# Patient Record
Sex: Female | Born: 1965 | Race: White | Hispanic: No | Marital: Married | Smoking: Former smoker
Health system: Southern US, Community
[De-identification: ages and names within clinical notes are randomized; demographics above are authoritative.]

## PROBLEM LIST (undated history)

## (undated) DIAGNOSIS — IMO0001 Reserved for inherently not codable concepts without codable children: Secondary | ICD-10-CM

## (undated) DIAGNOSIS — K219 Gastro-esophageal reflux disease without esophagitis: Secondary | ICD-10-CM

## (undated) DIAGNOSIS — N750 Cyst of Bartholin's gland: Secondary | ICD-10-CM

## (undated) HISTORY — DX: Reserved for inherently not codable concepts without codable children: IMO0001

## (undated) HISTORY — PX: FINGER SURGERY: SHX640

## (undated) HISTORY — DX: Gastro-esophageal reflux disease without esophagitis: K21.9

## (undated) HISTORY — PX: ABDOMINAL HYSTERECTOMY: SHX81

## (undated) HISTORY — DX: Cyst of Bartholin's gland: N75.0

---

## 2000-08-06 ENCOUNTER — Other Ambulatory Visit: Admission: RE | Admit: 2000-08-06 | Discharge: 2000-08-06 | Payer: Self-pay | Admitting: Obstetrics and Gynecology

## 2001-07-16 ENCOUNTER — Encounter: Payer: Self-pay | Admitting: Obstetrics and Gynecology

## 2001-07-16 ENCOUNTER — Ambulatory Visit (HOSPITAL_COMMUNITY): Admission: RE | Admit: 2001-07-16 | Discharge: 2001-07-16 | Payer: Self-pay | Admitting: Obstetrics and Gynecology

## 2002-01-23 ENCOUNTER — Inpatient Hospital Stay (HOSPITAL_COMMUNITY): Admission: RE | Admit: 2002-01-23 | Discharge: 2002-01-24 | Payer: Self-pay | Admitting: Obstetrics and Gynecology

## 2003-02-18 ENCOUNTER — Ambulatory Visit (HOSPITAL_COMMUNITY): Admission: RE | Admit: 2003-02-18 | Discharge: 2003-02-18 | Payer: Self-pay | Admitting: Obstetrics and Gynecology

## 2004-01-26 ENCOUNTER — Ambulatory Visit (HOSPITAL_COMMUNITY): Admission: RE | Admit: 2004-01-26 | Discharge: 2004-01-26 | Payer: Self-pay | Admitting: Obstetrics and Gynecology

## 2004-06-15 ENCOUNTER — Ambulatory Visit (HOSPITAL_COMMUNITY): Admission: RE | Admit: 2004-06-15 | Discharge: 2004-06-15 | Payer: Self-pay | Admitting: Obstetrics and Gynecology

## 2005-02-06 ENCOUNTER — Ambulatory Visit: Payer: Self-pay | Admitting: Internal Medicine

## 2005-02-14 ENCOUNTER — Ambulatory Visit (HOSPITAL_COMMUNITY): Admission: RE | Admit: 2005-02-14 | Discharge: 2005-02-14 | Payer: Self-pay | Admitting: Obstetrics and Gynecology

## 2005-12-05 ENCOUNTER — Ambulatory Visit (HOSPITAL_COMMUNITY): Admission: RE | Admit: 2005-12-05 | Discharge: 2005-12-05 | Payer: Self-pay | Admitting: Obstetrics and Gynecology

## 2006-12-07 ENCOUNTER — Ambulatory Visit (HOSPITAL_COMMUNITY): Admission: RE | Admit: 2006-12-07 | Discharge: 2006-12-07 | Payer: Self-pay | Admitting: Obstetrics and Gynecology

## 2007-08-25 IMAGING — CT CT ABDOMEN W/ CM
2 of 3 series · 14 of 32 positions shown, 20 images · IV contrast (CONTRAST)
Comparison: 01/26/04.

CLINICAL DATA: Chronic left abdominal pain.
ABDOMEN CT WITH CONTRAST:
TECHNIQUE: Multidetector CT imaging of the abdomen was performed following the standard protocol during bolus administration of intravenous contrast.
Contrast:  100 cc Omnipaque 300.

[Series 3258: — · axial · 0.63mm/px · z∈[+1580,+1814]mm · 12 of 57 slices shown, 18 images (1 of 2)]
[im 5/57  soft-tissue]
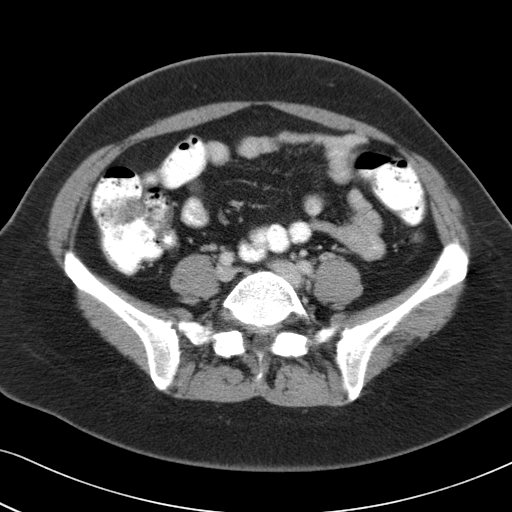
[im 5/57  bone]
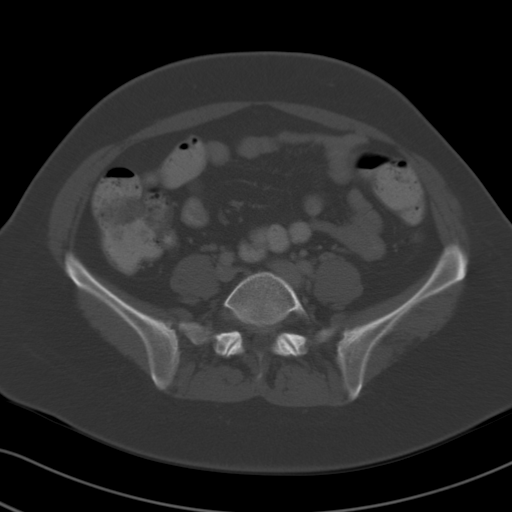
[im 9/57  soft-tissue]
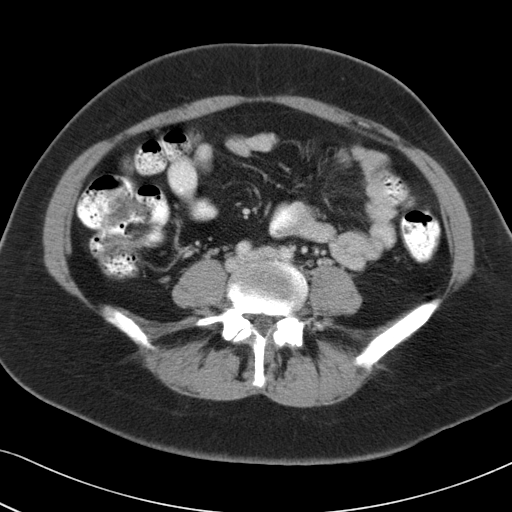
[im 13/57  soft-tissue]
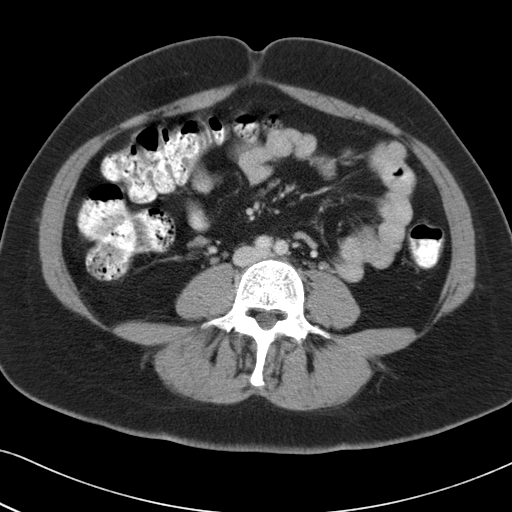
[im 18/57  soft-tissue]
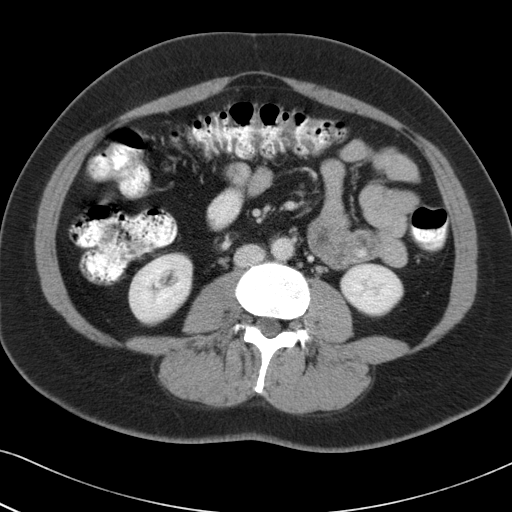
[im 22/57  soft-tissue]
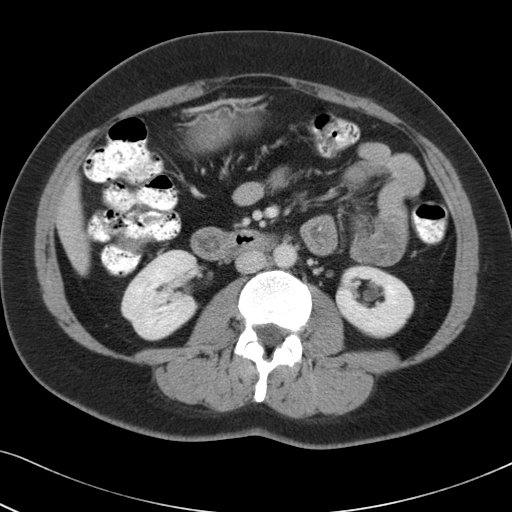
[im 26/57  soft-tissue]
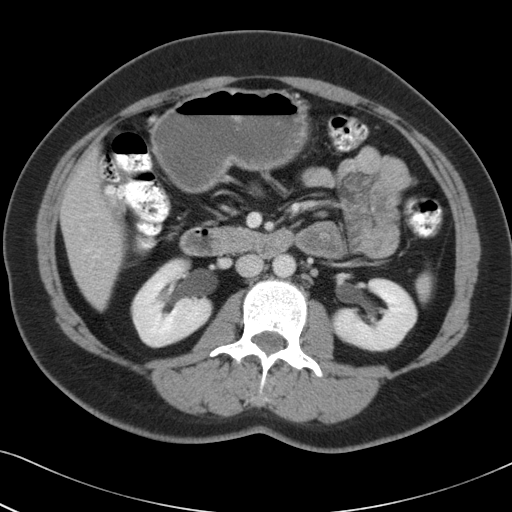
[im 31/57  soft-tissue]
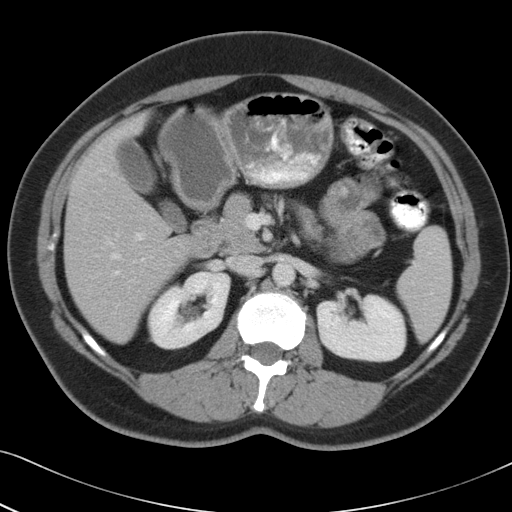
[im 35/57  soft-tissue]
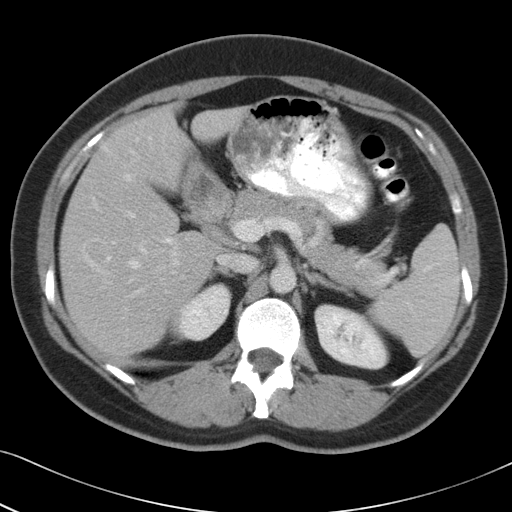
[im 39/57  soft-tissue]
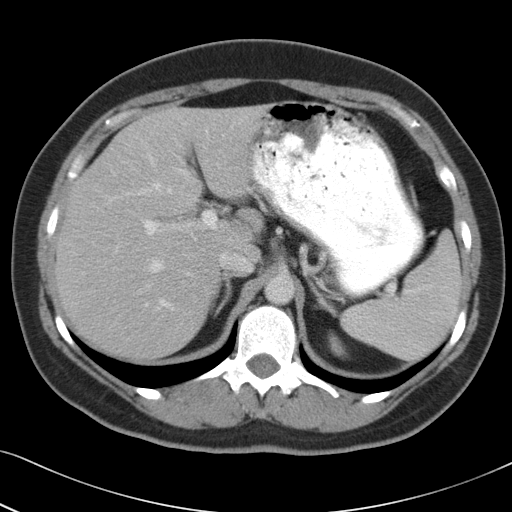
[im 39/57  lung]
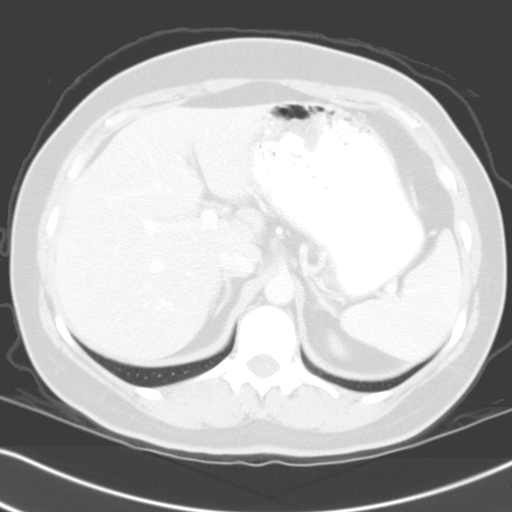
[im 39/57  bone]
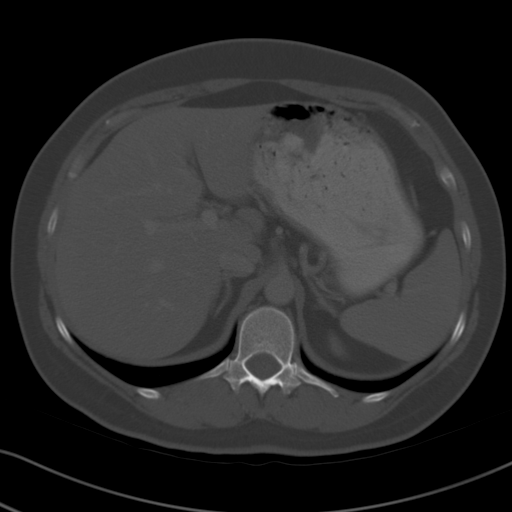
[im 44/57  soft-tissue]
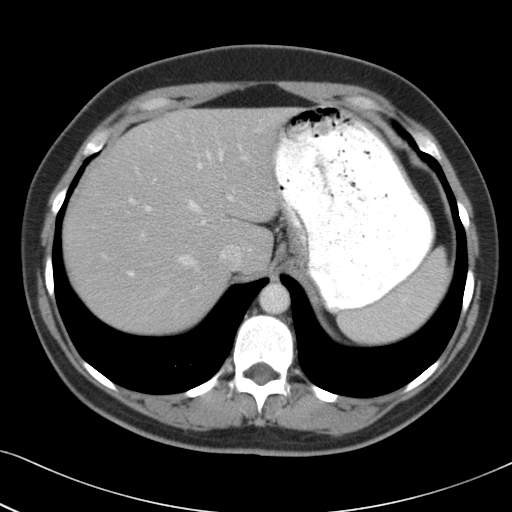
[im 44/57  lung]
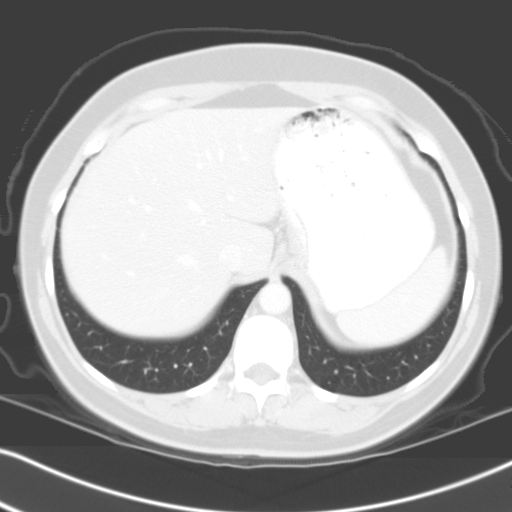
[im 48/57  soft-tissue]
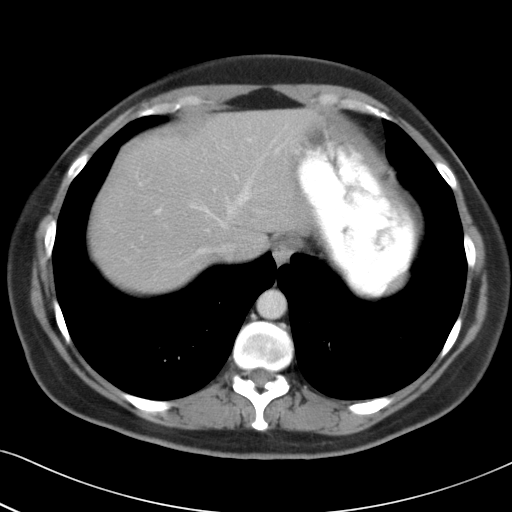
[im 48/57  lung]
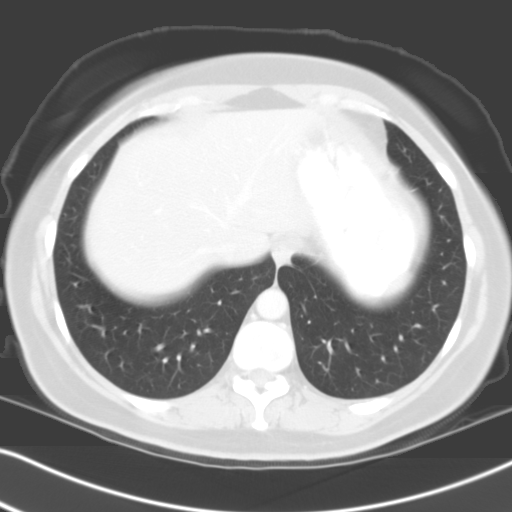
[im 52/57  soft-tissue]
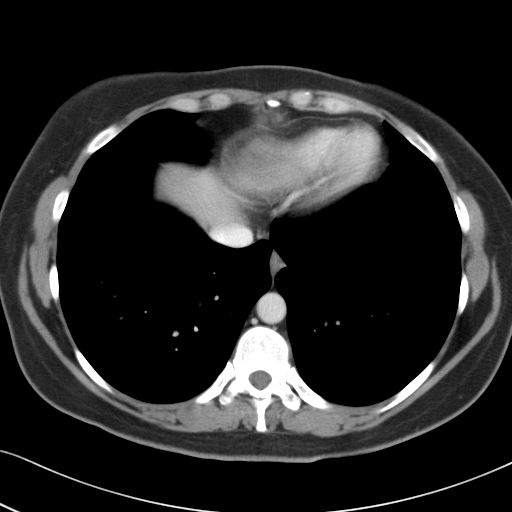
[im 52/57  lung]
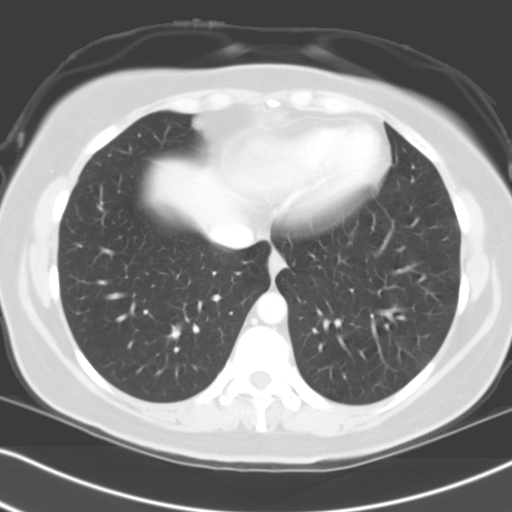

[Series 3260: — · axial · 0.63mm/px · z∈[+1740,+1764]mm · 2 of 25 slices shown (2 of 2)]
[im 5/25  soft-tissue]
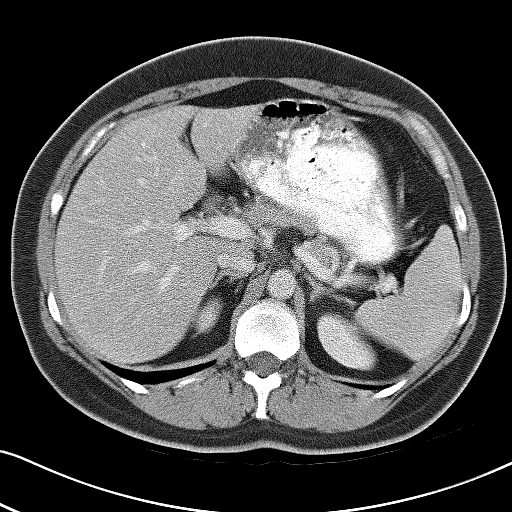
[im 10/25  soft-tissue]
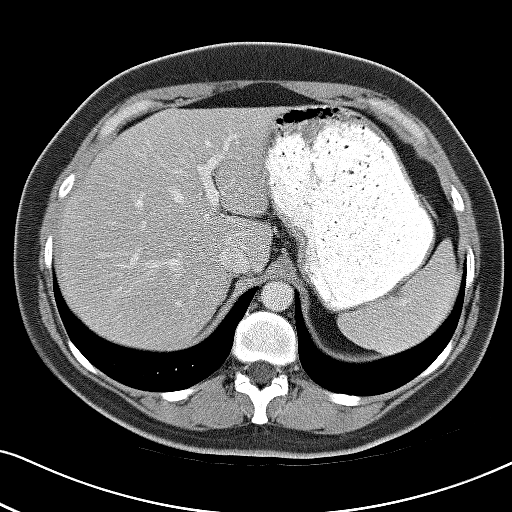

[14 of 32 positions shown; findings below may reference images not displayed]

FINDINGS: The liver, gallbladder, spleen, adrenal glands, kidneys, and pancreas are unremarkable.  No enlarged lymph nodes, free fluid, abdominal aortic aneurysm, or biliary dilatation identified.  A few scattered descending colonic diverticula are identified without evidence of diverticulitis.  The remainder of the visualized bowel is unremarkable.  Visualized portions of the SMA, IMA, celiac artery and SMV are patent.
IMPRESSION: Unremarkable CT scan of the abdomen with contrast.

## 2007-12-10 ENCOUNTER — Ambulatory Visit (HOSPITAL_COMMUNITY): Admission: RE | Admit: 2007-12-10 | Discharge: 2007-12-10 | Payer: Self-pay | Admitting: Obstetrics and Gynecology

## 2009-06-24 ENCOUNTER — Ambulatory Visit (HOSPITAL_COMMUNITY): Admission: RE | Admit: 2009-06-24 | Discharge: 2009-06-24 | Payer: Self-pay | Admitting: Obstetrics and Gynecology

## 2010-05-20 NOTE — Op Note (Signed)
NAME:  Julie Le, Julie Le                         ACCOUNT NO.:  [id number]   MEDICAL RECORD NO.:  [id number]                   PATIENT TYPE:  INP   LOCATION:  A302                                 FACILITY:  [facility]   PHYSICIAN:  Tilda Burrow, M.D.              DATE OF BIRTH:  1965/05/08   DATE OF PROCEDURE:  01/23/2002  DATE OF DISCHARGE:                                 OPERATIVE REPORT   PREOPERATIVE DIAGNOSIS:  Menometrorrhagia.   POSTOPERATIVE DIAGNOSIS:  Menometrorrhagia.   PROCEDURE:  Vaginal hysterectomy.   SURGEON:  Tilda Burrow, M.D.   ASSISTANTCurley Spice, CST and [place], [place]   ANESTHESIA:  General by Dennison Mascot, CRNA   COMPLICATIONS:  None.   FINDINGS:  Uterine descensus to just inside introitus with minimal traction,  easily mobile normal adnexal structures.   DETAILS OF PROCEDURE:  The patient was taken to the operating room and  prepped and draped int he usual fashion for vaginal procedure with the legs  supported in high lithotomy candy cane stirrups.  After prepping and  draping, the weighted speculum, 3-inch length, was placed in the posterior  vagina.  The cervix grasped with a Lahey thyroid tenaculum and pulled to the  introitus.  The cervicovaginal fornix was opened anteriorly, lifting the  bladder up and identifying the vesicouterine reflection of peritoneum and  entering with out difficulty.  The uterus could be flipped anteriorly, as in  doderlein technique.  The Lahey thyroid tenaculum was placed in the uterine  fundus and procedure proceeded from the cornual region towards the cervix.  We first used the curved Zeppelin clamp to cross-clamped the round ligament,  fallopian tube and utero-ovarian ligament, which were then transected,  ligated doubly with #0 chromic and tagged.   The broad ligament was then inferiorly taken down in small bites using  Zeppelin clamp, mayo scissor transection and #0 chromic suture ligature.  We  marched down on  each side in symmetric fashion until reaching the level of  the insertion of the uterosacral ligaments.  At this time, it was quite easy  to identify the uterosacral ligaments and a #0 chromic silk suture was  placed approximately 1 cm up the uterosacral ligament on each side and  pulled together into 1 suture, and tagged.   The uterosacral ligaments were then cross-clamped with Zeppelin clamp,  transected, and the cervix amputated off the remaining portion of vaginal  cuff.  A figure-of-eight suture was placed over the posterior cuff edge. The  uterosacral ligament on each side was doubly ligated with #0 chromic and  tagged.   Hemostasis was excellent.  The pedicles to the tubes and ovaries were  inspected and released.  The ovaries remained at the vaginal apex and did  not retract well.  We inspected and made a separating incision beneath the  fallopian tube and utero-ovarian ligament pedicle to allow them to be more  mobile so that they could elevate up away from the pelvic floor.  This was  quite successful.  They could then be lifted up.  Hemostasis remained good.  The pursestring closure of the pelvic peritoneum was then performed and then  the cuff closed in the midline, side-to-side, with interrupted sutures of #0  chromic.  Hemostasis was sufficiently good that vaginal packing was not  considered necessary.  Foley catheter was inserted in the bladder revealing  clear urine.  The patient tolerated the procedure well and went to recovery  room in excellent condition.                                               Tilda Burrow, M.D.    JVF/MEDQ  D:  01/23/2002  T:  01/23/2002  Job:  [id number]

## 2010-05-20 NOTE — H&P (Signed)
NAME:  Julie Le, Julie Le                         ACCOUNT NO.:  [id number]   MEDICAL RECORD NO.:  [id number]                   PATIENT TYPE:  AMB   LOCATION:  DAY                                  FACILITY:  [facility]   PHYSICIAN:  Tilda Burrow, M.D.              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 Failed efforts at medical therapy.   HISTORY OF PRESENT ILLNESS:  This 45 year old female, Gravida II, Para II,  is admitted at this time for vaginal hysterectomy due to heavy and prolonged  menstrual periods. Julie Le has been followed through out office. She has  failed efforts at medical therapy. She complains of severe abdominal pain  and heavy flow with her menses. Pelvic ultrasound shows small uterus with a  simple cyst on the left ovary, considered functional and a small cyst on the  left ovary which on follow-up ultrasound in our office, appeared grossly  normal. Discussion of the pros and cons of hormone therapy versus surgical  options has been conducted with lengthy review of the risks and benefits and  the patient requests a hysterectomy. Technical aspects of the procedure have  been performed using Gynecare visual guides. The endometrial ablation was  discussed and the patient declined consideration of that. She prefers to be  considered a hysterectomy candidate.   PAST MEDICAL HISTORY:  Benign surgical OB history. She is Julie Le, Para  II, last delivery October of 1999.   ALLERGIES:  SULFA, CODEINE, AND NARCOTICS. The patient describes her  allergy to narcotics as she become nauseated in response to most narcotics  without any specifics.   PHYSICAL EXAMINATION:  VITAL SIGNS: Height 5'5, weight 172 pounds. Blood  pressure 130/70, pulse 76.  GENERAL: A cheerful, somewhat anxious, Caucasian female. Alert and  oriented  times three.  HEENT: Pupils are equal, round, and reactive to light and accommodation.  Extraocular muscles intact.  NECK: Supple. Trachea midline. Pharynx usually visualized. Posterior  pharynx.  ABDOMEN: Slim without masses.  GU: Normal female genitalia. First degree uterine descensus cervix. Normal  class I PAP smear. Uterus retroverted. Adnexa negative for masses at this  time with ultrasound showing physiologic ovarian cyst only.    PLAN:  Vaginal hysterectomy. Pros and cons of the procedure include specific  need for blood if in cases of bleeding or the potential for risk of injury  to bowel, bladder, ureters, or other adjacent structures. Reviewed with the  patient the potential for infection or unusual anesthesia risks (1 in  20,000). Specifically reviewed with the patient, who acknowledges these  risks.  Tilda Burrow, M.D.    JVF/MEDQ  D:  01/21/2002  T:  01/21/2002  Job:  [id number]

## 2010-05-20 NOTE — Discharge Summary (Signed)
   NAME:  Julie Le, TSCHIDA                         ACCOUNT NO.:  [id number]   MEDICAL RECORD NO.:  [id number]                   PATIENT TYPE:  INP   LOCATION:  A302                                 FACILITY:  [facility]   PHYSICIAN:  Tilda Burrow, M.D.              DATE OF BIRTH:  10-21-1965   DATE OF ADMISSION:  01/23/2002  DATE OF DISCHARGE:  01/24/2002                                 DISCHARGE SUMMARY   ADMITTING DIAGNOSIS:  First-degree uterine descensus having prolonged menses  (menometrorrhagia).   DISCHARGE DIAGNOSES:  First-degree uterine descensus having prolonged menses  (menometrorrhagia).   PROCEDURE:  Vaginal hysterectomy.   DISCHARGE MEDICATIONS:  Tylox 1-2 q.4h. p.r.n. pain.   FOLLOW UP:  Follow up in two weeks, then four weeks at our office.   HISTORY OF PRESENT ILLNESS:  This healthy 45 year old female gravida 2, para 2 is admitted for  hysterectomy for heavy and prolonged menses.  She declined consideration of  endometrial ablation.  Menses were greater than one week in length and had  failed at multiple medical therapies.   PAST MEDICAL HISTORY:  Benign.   PAST SURGICAL HISTORY:  Negative.   OBSTETRICAL HISTORY:  Gravida 2, para 2.  Delivery last in October 1999.   ALLERGIES:  NARCOTICS sensitivity with nausea as a side effect.   PHYSICAL EXAMINATION:  VITAL SIGNS:  Height 5 feet 5 inches, weight 172  pounds.  ABDOMEN:  Generally normal abdominal exam.  GYNECOLOGIC:  She has first-degree uterine descensus.  Class I Pap smear,  retroverted uterus, negative adnexa for masses.   HOSPITAL COURSE:  The patient was admitted and underwent vaginal  hysterectomy with a small amount of blood loss estimated at 100 mL with good  urinary output.  Postoperative day #1, the patient had a postoperative  hemoglobin of 11.6, hematocrit 33.1 compared to preoperative values of 13.7  and 39.3.  The patient was considered stable for discharge and was sent home  in excellent  condition for a followup in two weeks and six weeks at our  office.    ADDENDUM:  Pathology report returned showing benign cervix with cervicitis  only and a proliferative endometrium and small intramural leiomyoma.                                               Tilda Burrow, M.D.    JVF/MEDQ  D:  02/12/2002  T:  02/12/2002  Job:  [id number]

## 2010-12-12 ENCOUNTER — Other Ambulatory Visit: Payer: Self-pay | Admitting: Obstetrics and Gynecology

## 2010-12-12 DIAGNOSIS — Z139 Encounter for screening, unspecified: Secondary | ICD-10-CM

## 2010-12-19 ENCOUNTER — Ambulatory Visit (HOSPITAL_COMMUNITY): Payer: Self-pay

## 2011-01-23 ENCOUNTER — Ambulatory Visit (HOSPITAL_COMMUNITY): Payer: BC Managed Care – PPO

## 2011-02-21 ENCOUNTER — Other Ambulatory Visit: Payer: Self-pay | Admitting: Obstetrics and Gynecology

## 2011-02-21 DIAGNOSIS — Z139 Encounter for screening, unspecified: Secondary | ICD-10-CM

## 2011-02-23 ENCOUNTER — Ambulatory Visit (HOSPITAL_COMMUNITY)
Admission: RE | Admit: 2011-02-23 | Discharge: 2011-02-23 | Disposition: A | Discharge status: Home / Self Care | Payer: BC Managed Care – PPO | Source: Ambulatory Visit | Attending: Obstetrics and Gynecology | Admitting: Obstetrics and Gynecology

## 2011-02-23 DIAGNOSIS — Z139 Encounter for screening, unspecified: Secondary | ICD-10-CM

## 2011-02-23 DIAGNOSIS — Z1231 Encounter for screening mammogram for malignant neoplasm of breast: Secondary | ICD-10-CM | POA: Insufficient documentation

## 2011-02-28 ENCOUNTER — Other Ambulatory Visit: Payer: Self-pay | Admitting: Obstetrics and Gynecology

## 2011-02-28 DIAGNOSIS — R928 Other abnormal and inconclusive findings on diagnostic imaging of breast: Secondary | ICD-10-CM

## 2011-03-07 ENCOUNTER — Ambulatory Visit
Admission: RE | Admit: 2011-03-07 | Discharge: 2011-03-07 | Disposition: A | Discharge status: Home / Self Care | Payer: BC Managed Care – PPO | Source: Ambulatory Visit | Attending: Obstetrics and Gynecology | Admitting: Obstetrics and Gynecology

## 2011-03-07 DIAGNOSIS — R928 Other abnormal and inconclusive findings on diagnostic imaging of breast: Secondary | ICD-10-CM

## 2013-07-07 ENCOUNTER — Encounter: Payer: Self-pay | Admitting: Adult Health

## 2013-07-07 ENCOUNTER — Ambulatory Visit (INDEPENDENT_AMBULATORY_CARE_PROVIDER_SITE_OTHER): Payer: BC Managed Care – PPO | Admitting: Adult Health

## 2013-07-07 VITALS — BP 160/90 | Ht 65.0 in | Wt 181.0 lb

## 2013-07-07 DIAGNOSIS — N9089 Other specified noninflammatory disorders of vulva and perineum: Secondary | ICD-10-CM

## 2013-07-07 DIAGNOSIS — N907 Vulvar cyst: Secondary | ICD-10-CM

## 2013-07-07 NOTE — Patient Instructions (Signed)
Epidermal Cyst An epidermal cyst is sometimes called a sebaceous cyst, epidermal inclusion cyst, or infundibular cyst. These cysts usually contain a substance that looks "pasty" or "cheesy" and may have a bad smell. This substance is a protein called keratin. Epidermal cysts are usually found on the face, neck, or trunk. They may also occur in the vaginal area or other parts of the genitalia of both men and women. Epidermal cysts are usually small, painless, slow-growing bumps or lumps that move freely under the skin. It is important not to try to pop them. This may cause an infection and lead to tenderness and swelling. CAUSES  Epidermal cysts may be caused by a deep penetrating injury to the skin or a plugged hair follicle, often associated with acne. SYMPTOMS  Epidermal cysts can become inflamed and cause:  Redness.  Tenderness.  Increased temperature of the skin over the bumps or lumps.  Grayish-white, bad smelling material that drains from the bump or lump. DIAGNOSIS  Epidermal cysts are easily diagnosed by your caregiver during an exam. Rarely, a tissue sample (biopsy) may be taken to rule out other conditions that may resemble epidermal cysts. TREATMENT   Epidermal cysts often get better and disappear on their own. They are rarely ever cancerous.  If a cyst becomes infected, it may become inflamed and tender. This may require opening and draining the cyst. Treatment with antibiotics may be necessary. When the infection is gone, the cyst may be removed with minor surgery.  Small, inflamed cysts can often be treated with antibiotics or by injecting steroid medicines.  Sometimes, epidermal cysts become large and bothersome. If this happens, surgical removal in your caregiver's office may be necessary. HOME CARE INSTRUCTIONS  Only take over-the-counter or prescription medicines as directed by your caregiver.  Take your antibiotics as directed. Finish them even if you start to feel  better. SEEK MEDICAL CARE IF:   Your cyst becomes tender, red, or swollen.  Your condition is not improving or is getting worse.  You have any other questions or concerns. MAKE SURE YOU:  Understand these instructions.  Will watch your condition.  Will get help right away if you are not doing well or get worse. Document Released: 11/20/2003 Document Revised: 03/13/2011 Document Reviewed: 06/27/2010 [facility]Exit[facility] Patient Information 2015 [employer], [place]. This information is not intended to replace advice given to you by your health care provider. Make sure you discuss any questions you have with your health care provider. Follow prn

## 2013-07-07 NOTE — Progress Notes (Signed)
Subjective:     Patient ID: Julie Le, female   DOB: 09/07/1965, 48 y.o.   MRN: [id number]  HPI Julie Le is a 48 year old white female, in complaining of bump left labia, has history of bartholin cyst. She said she looked it up on computer and thinks it is cancer.  Review of Systems See HPI Reviewed past medical,surgical, social and family history. Reviewed medications and allergies.     Objective:   Physical Exam BP 160/90  Ht 5\' 5"  (1.651 m)  Wt 181 lb (82.101 kg)  BMI 30.12 kg/m2   Skin warm and dry has round smooth white nodule left labia, looks like epidermal cyst and when pressure applied keratin like material expressed easily and area almost resolved.Pt watched with mirror then checked inner left labia no bartholin cyst. Showed her a picture from genital dermatology book and she felt better.  Assessment:     Epidermal cyst left labia    Plan:     Follow up prn Review handout on epidermal cyst

## 2013-11-03 ENCOUNTER — Encounter: Payer: Self-pay | Admitting: Adult Health

## 2020-05-06 ENCOUNTER — Encounter: Payer: Self-pay | Admitting: Obstetrics & Gynecology
